# Patient Record
Sex: Female | Born: 2000 | Race: White | Hispanic: No | Marital: Single | Smoking: Never smoker
Health system: Southern US, Community
[De-identification: ages and names within clinical notes are randomized; demographics above are authoritative.]

## PROBLEM LIST (undated history)

## (undated) ENCOUNTER — Ambulatory Visit

## (undated) ENCOUNTER — Ambulatory Visit
Payer: Medicaid (Managed Care) | Attending: Student in an Organized Health Care Education/Training Program | Primary: Student in an Organized Health Care Education/Training Program

## (undated) ENCOUNTER — Telehealth

## (undated) ENCOUNTER — Encounter

## (undated) ENCOUNTER — Telehealth
Attending: Student in an Organized Health Care Education/Training Program | Primary: Student in an Organized Health Care Education/Training Program

## (undated) ENCOUNTER — Encounter
Attending: Student in an Organized Health Care Education/Training Program | Primary: Student in an Organized Health Care Education/Training Program

## (undated) ENCOUNTER — Telehealth: Attending: Dermatology | Primary: Dermatology

## (undated) ENCOUNTER — Emergency Department: Disposition: A | Discharge status: Home / Self Care | Payer: BLUE CROSS/BLUE SHIELD

## (undated) DIAGNOSIS — L732 Hidradenitis suppurativa: Secondary | ICD-10-CM

## (undated) DIAGNOSIS — L409 Psoriasis, unspecified: Secondary | ICD-10-CM

## (undated) DIAGNOSIS — F319 Bipolar disorder, unspecified: Secondary | ICD-10-CM

## (undated) HISTORY — DX: Hidradenitis suppurativa: L73.2

## (undated) HISTORY — DX: Bipolar disorder, unspecified: F31.9

## (undated) HISTORY — DX: Psoriasis, unspecified: L40.9

---
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
## 2021-11-11 NOTE — Unmapped (Signed)
Will send message to SR to contact patient to schd HS appt with Dr. Janyth Contes..per message from nurse .Marland Kitchen MJ

## 2021-11-11 NOTE — Unmapped (Signed)
Patient calling to request an appointment with Dr. Janyth Contes; states she is experiencing psoriatic arthritis and other skin issues and it has been a while since she's had an appt.     Message routed to call center.

## 2021-12-10 NOTE — Unmapped (Signed)
LVM to discuss scheduling an appt.       It's been a long time since I've seen her an doesn't sound overly urgent - we can offer with others first and if she wants to wait longer or be on wait list for me that would be okay.     You  Elsie Stain, MD2 weeks ago     DR  Do you want this patient , or do you want her to  see anyone since it experiencing psoriatic arthritis or do you wna    Patient calling to request an appointment with Dr. Janyth Contes; states she is experiencing psoriatic arthritis and other skin issues and it has been a while since she's had an appt.

## 2022-02-11 ENCOUNTER — Other Ambulatory Visit: Payer: Self-pay | Admitting: Pediatrics

## 2022-02-11 ENCOUNTER — Ambulatory Visit
Admission: RE | Admit: 2022-02-11 | Discharge: 2022-02-11 | Disposition: A | Discharge status: Home / Self Care | Payer: BLUE CROSS/BLUE SHIELD | Source: Ambulatory Visit | Attending: Pediatrics | Admitting: Pediatrics

## 2022-02-11 ENCOUNTER — Ambulatory Visit
Admission: RE | Admit: 2022-02-11 | Discharge: 2022-02-11 | Disposition: A | Discharge status: Home / Self Care | Payer: BLUE CROSS/BLUE SHIELD | Attending: Pediatrics | Admitting: Pediatrics

## 2022-02-11 DIAGNOSIS — R059 Cough, unspecified: Secondary | ICD-10-CM

## 2023-05-09 ENCOUNTER — Ambulatory Visit
Admit: 2023-05-09 | Discharge: 2023-05-09 | Disposition: A | Discharge status: Home / Self Care | Payer: BLUE CROSS/BLUE SHIELD | Attending: Emergency Medicine

## 2023-05-09 DIAGNOSIS — E860 Dehydration: Principal | ICD-10-CM

## 2023-05-09 DIAGNOSIS — F439 Reaction to severe stress, unspecified: Principal | ICD-10-CM

## 2023-05-09 DIAGNOSIS — R5383 Other fatigue: Principal | ICD-10-CM

## 2023-05-09 LAB — CBC W/ AUTO DIFF
BASOPHILS ABSOLUTE COUNT: 0 10*9/L (ref 0.0–0.1)
BASOPHILS RELATIVE PERCENT: 0.3 %
EOSINOPHILS ABSOLUTE COUNT: 0.2 10*9/L (ref 0.0–0.5)
EOSINOPHILS RELATIVE PERCENT: 1.8 %
HEMATOCRIT: 37.4 % (ref 34.0–44.0)
HEMOGLOBIN: 12 g/dL (ref 11.3–14.9)
LYMPHOCYTES ABSOLUTE COUNT: 1.8 10*9/L (ref 1.1–3.6)
LYMPHOCYTES RELATIVE PERCENT: 15.7 %
MEAN CORPUSCULAR HEMOGLOBIN CONC: 32.1 g/dL (ref 32.0–36.0)
MEAN CORPUSCULAR HEMOGLOBIN: 23.6 pg — ABNORMAL LOW (ref 25.9–32.4)
MEAN CORPUSCULAR VOLUME: 73.6 fL — ABNORMAL LOW (ref 77.6–95.7)
MEAN PLATELET VOLUME: 8.4 fL (ref 6.8–10.7)
MONOCYTES ABSOLUTE COUNT: 0.8 10*9/L (ref 0.3–0.8)
MONOCYTES RELATIVE PERCENT: 6.9 %
NEUTROPHILS ABSOLUTE COUNT: 8.6 10*9/L — ABNORMAL HIGH (ref 1.8–7.8)
NEUTROPHILS RELATIVE PERCENT: 75.3 %
NUCLEATED RED BLOOD CELLS: 0 /100{WBCs} (ref <=4)
PLATELET COUNT: 408 10*9/L (ref 150–450)
RED BLOOD CELL COUNT: 5.08 10*12/L (ref 3.95–5.13)
RED CELL DISTRIBUTION WIDTH: 14.7 % (ref 12.2–15.2)
WBC ADJUSTED: 11.4 10*9/L — ABNORMAL HIGH (ref 3.6–11.2)

## 2023-05-09 LAB — COMPREHENSIVE METABOLIC PANEL
ALBUMIN: 3.6 g/dL (ref 3.4–5.0)
ALKALINE PHOSPHATASE: 77 U/L (ref 46–116)
ALT (SGPT): 9 U/L — ABNORMAL LOW (ref 10–49)
ANION GAP: 12 mmol/L (ref 5–14)
AST (SGOT): 19 U/L (ref <=34)
BILIRUBIN TOTAL: 0.2 mg/dL — ABNORMAL LOW (ref 0.3–1.2)
BLOOD UREA NITROGEN: 10 mg/dL (ref 9–23)
BUN / CREAT RATIO: 15
CALCIUM: 9.4 mg/dL (ref 8.7–10.4)
CHLORIDE: 107 mmol/L (ref 98–107)
CO2: 22.7 mmol/L (ref 20.0–31.0)
CREATININE: 0.66 mg/dL (ref 0.55–1.02)
EGFR CKD-EPI (2021) FEMALE: >90 mL/min/{1.73_m2} (ref >=60)
GLUCOSE RANDOM: 88 mg/dL (ref 70–179)
POTASSIUM: 4.4 mmol/L (ref 3.4–4.8)
PROTEIN TOTAL: 7.2 g/dL (ref 5.7–8.2)
SODIUM: 142 mmol/L (ref 135–145)

## 2023-05-09 LAB — TSH: THYROID STIMULATING HORMONE: 3.442 u[IU]/mL (ref 0.550–4.780)

## 2023-05-09 LAB — MAGNESIUM: MAGNESIUM: 1.7 mg/dL (ref 1.6–2.6)

## 2023-05-09 LAB — HCG QUANTITATIVE, BLOOD: GONADOTROPIN, CHORIONIC (HCG) QUANT: <2.6 m[IU]/mL

## 2023-05-09 LAB — D-DIMER, QUANTITATIVE: D-DIMER QUANTITATIVE (CW,ML,HL,HS,CH,JS,JC,RX,RH): 310 ng{FEU}/mL (ref <=500)

## 2023-05-09 LAB — HIGH SENSITIVITY TROPONIN I - SINGLE: HIGH SENSITIVITY TROPONIN I: <3 ng/L (ref <=34)

## 2023-05-10 NOTE — Unmapped (Signed)
[facility]  Emergency Department Provider Note       ED Clinical Impression     Final diagnoses:   Fatigue, unspecified type (Primary)   Dehydration   Stress        Impression, ED Course, Assessment and Plan     Impression:     Patient is a 22 y.o. female with PMH of PCOS, anemia presenting with acute on chronic lightheadedness for the last 3 months as delineated below.    On exam, the patient is non-toxic appearing and in no acute distress. VS are notable for mild tachycardia to 102 but otherwise within normal limits. Physical exam revealed  A&Ox3  Speech normal cadence and fluent  CNs II-XII intact and symmetric  No nystagmus on smooth pursuit  Strength 5/5 in flexion/extension, symmetric x 4  Holds all 4 extrem to antigravity x 5 seconds  SILT  No dysmetria on FtN  No protnator drift  NL Gait  No nuchal rigidity.      Differential diagnosis is broad and includes dehydration, lightheadedness, orthostatic hypotension, anemia, low suspicion for ACS, myocarditis, PE given no tachypnea, hypoxia, chest pain, also consider vertigo although no findings suspicious for this on exam such as nystagmus, this seems to be more orthostatic in nature.  Consider pregnancy related etiologies, consider infectious etiologies however no fevers or chills.  No foci of infection.  As for her multiple chronic symptoms, unclear etiology, presentation is not consistent with acute subarachnoid hemorrhage, sentinel bleed, she has chronic headaches, I do suspect that she could be having rebound headaches from her chronic NSAID use.  No GI bleed symptoms such as hematochezia or melena.  Consider autoimmune, electrolyte disturbance, POTS.  Consider primary intracranial etiology however nonfocal neurological exam, she has history of chronic headaches and no acute changes to her headaches.  Do consider possibility for intracranial mass and/or malignancy as well.  Also consider functional etiology given her life stressors.    Plan for EKG, basic labs, TSH, magnesium, single troponin, d-dimer, RPP, and UA.  Will provide p.o. fluids.    Reassessment-    Labs with mild leukocytosis, which I discussed with the patient she does have a neutrophil predominance however she has no obvious foci infection.  Electrolytes grossly unremarkable, troponin negative, D-dimer negative, negative pregnancy test.  Remains well-appearing, nontoxic, hemodynamically stable, provided with oral hydration here in the emergency department secondary to IV fluid shortage nationally at the current time of health care.  She is able to tolerate p.o. well, ambulate without any symptoms of severe presyncope.  We discussed the importance of aggressive oral hydration and close PCP follow-up for further management and workup as indicated for her symptoms.    I discussed my evaluation of the patient's symptoms, my clinical impression, and my proposed outpatient treatment plan. We have discussed anticipatory guidance, scheduled follow-up, and careful return precautions. I provided an opportunity and answered any questions the patient had. The patient express understanding and are comfortable with the discharge plan.      Additional Medical Decision Making       Discussion of Management with other Physicians, QHP or Appropriate Source: N/A  Independent Interpretation of Studies:   EKG: as below    RADIOLOGY: as below     Labs as above  External Records Reviewed: Patient's most recent outpatient clinic note  Escalation of Care, Consideration of Admission/Observation/Transfer:  N/A  Social determinants that significantly affected care:  Prescription drug(s) considered but not prescribed:   Diagnostic  tests considered but not performed:   History obtained from other sources:     Labs and radiology results that were available during my care of the patient were independently reviewed by me and considered in my medical decision making.    Portions of this record have been created using Scientist, clinical (histocompatibility and immunogenetics). Dictation errors have been sought, but may not have been identified and corrected.  ____________________________________________    Time seen: May 09, 2023 5:53 PM     History     Chief Complaint  Fatigue      HPI   Nekeia A Sweezey is a 22 y.o. female with a past medical history of psoriatic arthritis, PCOS, asthma, and anemia who presents with global fatigue and lightheadedness. The patient reports waking up this morning and feeling lightheaded when attempting to stand in the setting of several months of global fatigue and presyncope. She notes that her symptoms are worsened with positional changes such as from laying to standing.  Associated symptoms with this include mild shortness of breath.  Currently without any shortness of breath.  She states that she has had months of fatigue and feeling weak globally and even after a full night of sleep.  She endorses taking 3-4 tablets of Advil or Ibuprofen once or twice a day with relief of her chronic headaches.  No acute change in her symptomatology/headache today, not sudden onset, not worst of life. Of note, she has a history of irregular menstrual cycles such that her LMP ended 1 week ago and lasted for 2 weeks and that her cycle before lasted for 2 months. They advised that she follow up but she has yet to do so.  No fevers, chills, cough, dysuria, melena or hematochezia.  No aggravating or alleviating factors. She notes experiencing a lot of familial stress lately. She denies SI, HI or AVH.    Past Medical History:   Diagnosis Date    Axillary abscess 07/02/2014    Bilateral    Ear infection 04/08/2017    Eczema     Hidradenitis suppurativa 04/01/2016    Obese     Psoriasis 01/28/2015       Past Surgical History:   Procedure Laterality Date    PR EXC SWEAT GLAND LESN AXILL,SIMPL Bilateral 07/03/2014    Procedure: EXCISION OF SKIN & SUBCUTANEOUS TISSUE FOR HIDRADENITIS, AXILLARY; WITH SIMPLE OR INTERMEDIATE REPAIR; Surgeon: Bunnie Pion, MD;  [facility]: [facility];  Service: Pediatric Surgery    SKIN BIOPSY         No current facility-administered medications for this encounter.    Current Outpatient Medications:     FEROSUL 325 mg (65 mg iron) tablet, Take 1 tablet by mouth., Disp: , Rfl:     FLUoxetine (PROZAC) 40 MG capsule, Take 40 mg by mouth daily., Disp: , Rfl:     ibuprofen (MOTRIN) 800 MG tablet, Take 1 tablet (800 mg total) by mouth every six (6) hours as needed., Disp: 30 tablet, Rfl: 0    triamcinolone (KENALOG) 0.1 % ointment, Apply topically two (2) times a day as needed., Disp: , Rfl:     Allergies  Patient has no known allergies.    Family History   Problem Relation Age of Onset    Rheum arthritis Mother     Hypotension Mother         Taking midodrine    Rheum arthritis Brother     Chromosomal disorder Sister  del 8(p23.3p23.3)    Other Brother         laryngeal cleft, static encephalopathy with autism and speech delay, and swallowing dysfunction     Other Father         Chronic pain for work injury, died of taking multiple medications  leading to respiratory failure at 21yo    Hypertension Brother     Other Brother         Dysautonomia?    Ehlers-Danlos syndrome Brother         Type 3    Melanoma Neg Hx     Basal cell carcinoma Neg Hx     Squamous cell carcinoma Neg Hx        Social History  Social History     Tobacco Use    Smoking status: Never    Smokeless tobacco: Never            Physical Exam     VITAL SIGNS:    ED Triage Vitals [05/09/23 1613]   Enc Vitals Group      BP 121/101      Heart Rate 102      SpO2 Pulse       Resp 18      Temp 36.8 ??C (98.3 ??F)      Temp Source Temporal      SpO2 100 %     Constitutional: Alert and oriented. Well appearing and in no distress.  Eyes: Conjunctivae are normal.  ENT       Head: Normocephalic and atraumatic.       Nose: No congestion.       Mouth/Throat: Mucous membranes are moist.       Neck: No stridor.  Cardiovascular: Normal rate, regular rhythm.   Respiratory: Normal respiratory effort. Breath sounds are normal.  Gastrointestinal: Soft and nontender, non-distended  Genitourinary:   Musculoskeletal: No lower extremity tenderness or edema.  Neurologic: Normal speech and language. No gross focal neurologic deficits are appreciated.  Remainder as delineated above  Skin: Skin is warm, dry and intact. No rash noted.  Psychiatric: Mood and affect are normal. Speech and behavior are normal.     EKG     Normal sinus rhythm at a rate of 90 bpm, no ST elevations, no ST depressions, no ectopy. When compared with ECG of 08/08/20, new T wave inversion located in lead III however no other acute changes, as well her D-dimer test was negative.       Pertinent labs & imaging results that were available during my care of the patient were reviewed by me and considered in my medical decision making (see chart for details).    Documentation assistance was provided by Humberto Seals, Scribe on May 09, 2023 at 6:59 PM for Carolee Rota, MD.     Documentation assistance was provided by the scribe in my presence.  The documentation recorded by the scribe has been reviewed by me and accurately reflects the services I personally performed.                Berkley Harvey, MD  05/09/23 1924

## 2023-05-10 NOTE — Unmapped (Signed)
Patient states over the past few weeks fatigue and dizziness.  States today she fell in the shower (no LOC/head strike) b/c legs gave out.   Endorses SOB. Recent period lasting two months.   Reports hx of same but has been getting acutely worse.   Pt notes having many health issues.

## 2023-12-16 NOTE — Unmapped (Signed)
 Assessment and Plan:     Christy Mercado is a lage female presenting to establish care     #Depression, Anxiety, OCD, bipolar depression?  -PHQ-9 score 13  -GAD-7 score: 12  -she does appear to have long standing history of anxiety and depression.   -she was referred to Psychiatry in 2018 but was placed on do not schedule list as she did not complete proper intake paperwork  -Today she reports she was diagnosed with BIPOLAR DEPRESSION with [facility] and was placed on Lamictal  (she has not been on this medication for a year however)   -Will RESTART Lamictal  and refer to psychiatry  -     lamoTRIgine  (LAMICTAL ) 25 MG tablet; Take 1 tablet (25 mg total) by mouth daily.  -     Ambulatory referral to Psychiatry; Future    #Iron  deficiency anemia, Inguinal Adenopathy, Hepatomegaly, Splenomegaly  -Incidental on previous labs and imaging (CT Scan in 2022 recommended repeat imaging to assess for adenopathy)  -These results were briefly discussed. Advised to repeat CT scan as recommended.   -order below placed:   -     CT abdomen pelvis with contrast; Future  -     CBC w/ Differential    #Obesity class II  Wt Readings from Last 3 Encounters:   12/17/23 (!) 115.8 kg (255 lb 3.2 oz)   08/09/20 118.8 kg (261 lb 14.4 oz) (>99%, Z= 2.53)*   03/17/17 118.1 kg (260 lb 5.8 oz) (>99%, Z= 2.76)*     * Growth percentiles are based on CDC (Girls, 2-20 Years) data.   -she had previously followed with [facility]--however has not been seen since 2021  -Offered referral to Bariatric Surgery or Weight Management with [facility]. She is agreeable. Referral to weight management in [place] placed. Will obtain labs below as well  -     Comprehensive Metabolic Panel  -     Hemoglobin A1c  -     Lipid Panel  -     Amb Referral for Weight Management; Future      #Hidradenitis Suppurative, Psoriasis  -She had previously been Dermatology    #Joint pain, Fatigue  -on chart review, previously saw Pediatric Rheumatology on 02/18/2016: Assessment and Plan:  Kitzia presents with an approximately 12 month history of episodic multiple joint pain in the setting of hidraenitis suppurativa (HS), previously treated with Humira without effect with this discontinued in 03/2015. She is significantly overweight. 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 There is no evidence for inflammatory arthritis in her peripheral joints. She does have some pain in her Achilles and areas where connective tissues join bone (enthesitis) as well as pain in her bilateral SI joints--this may be related to mechanical issues or it may be inflammatory--we will obtain a plain film of her SIs to evaluate for possible sacroiliitis. However, she also will benefit from PT to address her mechanical joint issues. She is referred to PT for strengthening and balancing of the quadriceps for joint protection. Also, it is asked that they begin a low impact, graduated program of aerobic conditioning. It is important that she work with her primary care provider on weight control/weight loss. This is discussed with [name] and her mother.    -She is concerned for psoriatic arthritis  -     Comprehensive Metabolic Panel  -     Vitamin D 25 Hydroxy (25OH D2 + D3)  -  TSH  -     Anti-Nuclear Antibody (ANA)  -     ENA Panel  -     Rheumatoid Factor, Quantitative  -     Cyclic Citrul Peptide Antibody, IgG  -     Cancel: HLA-B27 antigen  -     Anti-DNA antibody, double-stranded  -     HLA-B27 antigen; Future  -     Vitamin B12 Level; Future  -Will need to consider fibromyalgia on the differential as well      HEALTH MAINTENANCE  -She will need to return for Pap smear   -She is interested in STD testing. Orders placed  -     Chlamydia/Gonorrhoeae NAA  -     TRICHOMONAS NAA  -     Hepatitis C antibody  -     HIV Antigen/Antibody Combo  -     Syphilis Screen  -Received Tdap without difficulty. VIS provided  -     Tdap vaccine greater than or equal to 7yo IM (Adacel, Decavax)    To follow up in around 2 months for CPE but will also rediscuss depression and additional concerns.     I personally spent >60 minutes face-to-face and non-face-to-face in the care of this patient, which includes all pre, intra, and post visit time on the date of service.  All documented time was specific to the E/M visit and does not include any procedures that may have been performed.        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 Denies     Patient's last menstrual period was 11/18/2023.    She is always in pain, tired and fatigued       ROS:     ROS negative unless otherwise noted in HPI    Vital Signs:     Wt Readings from Last 3 Encounters:   12/17/23 (!) 115.8 kg (255 lb 3.2 oz)   08/09/20 118.8 kg (261 lb 14.4 oz) (>99%, Z= 2.53)*   03/17/17 118.1 kg (260 lb 5.8 oz) (>99%, Z= 2.76)*     * Growth percentiles are based on CDC (Girls, 2-20 Years) data.     Temp Readings from Last 3 Encounters:   12/17/23 36.6 ??C (97.8 ??F)   05/09/23 36.8 ??C (98.3 ??F) (Temporal)   08/11/20 37.1 ??C (98.8 ??F) (Oral)     BP Readings from Last  3 Encounters:   12/17/23 124/80   05/09/23 121/101   08/11/20 117/79     Pulse Readings from Last 3 Encounters:   12/17/23 84   05/09/23 102   08/11/20 81       Objective:     General Appearance: Alert, cooperative, no distress, appears stated age.   Head and Neck: Normocephalic, atraumatic   EENT: Eyes: PERRLA, Conjuntiva clear; EOMI, sclera clear  Respiratory:   Respiratory effort unremarkable. No wheezes   Musculoskeletal: Gait and station unremarkable. Normal ROM.   Extremities :No edema.  Peripheral pulses normal  Integumentary: Warm and dry.No rashes.  No abnormal appearing skin lesions on exposed skin  Neurologic: Alert and oriented x3. CN II-XII grossly intact.   Psychiatric: Mood and affect appropriate for situation.

## 2023-12-17 ENCOUNTER — Encounter
Admit: 2023-12-17 | Discharge: 2023-12-18 | Payer: Medicaid (Managed Care) | Attending: Student in an Organized Health Care Education/Training Program | Primary: Student in an Organized Health Care Education/Training Program

## 2023-12-17 ENCOUNTER — Ambulatory Visit: Admit: 2023-12-17 | Discharge: 2023-12-18 | Payer: Medicaid (Managed Care)

## 2023-12-17 DIAGNOSIS — D500 Iron deficiency anemia secondary to blood loss (chronic): Secondary | ICD-10-CM | POA: Diagnosis not present

## 2023-12-17 DIAGNOSIS — Z23 Encounter for immunization: Secondary | ICD-10-CM | POA: Diagnosis not present

## 2023-12-17 DIAGNOSIS — R5383 Other fatigue: Secondary | ICD-10-CM | POA: Diagnosis not present

## 2023-12-17 DIAGNOSIS — F32A Depression, unspecified: Secondary | ICD-10-CM | POA: Diagnosis not present

## 2023-12-17 DIAGNOSIS — L732 Hidradenitis suppurativa: Secondary | ICD-10-CM | POA: Diagnosis not present

## 2023-12-17 DIAGNOSIS — E66812 Obesity, Class II, BMI 35-39.9: Principal | ICD-10-CM

## 2023-12-17 DIAGNOSIS — F319 Bipolar disorder, unspecified: Secondary | ICD-10-CM | POA: Diagnosis not present

## 2023-12-17 DIAGNOSIS — Z113 Encounter for screening for infections with a predominantly sexual mode of transmission: Secondary | ICD-10-CM | POA: Diagnosis not present

## 2023-12-17 DIAGNOSIS — R160 Hepatomegaly, not elsewhere classified: Secondary | ICD-10-CM | POA: Diagnosis not present

## 2023-12-17 DIAGNOSIS — M2550 Pain in unspecified joint: Secondary | ICD-10-CM | POA: Diagnosis not present

## 2023-12-17 DIAGNOSIS — F419 Anxiety disorder, unspecified: Secondary | ICD-10-CM | POA: Diagnosis not present

## 2023-12-17 DIAGNOSIS — D509 Iron deficiency anemia, unspecified: Secondary | ICD-10-CM | POA: Diagnosis not present

## 2023-12-17 DIAGNOSIS — L409 Psoriasis, unspecified: Secondary | ICD-10-CM | POA: Diagnosis not present

## 2023-12-17 DIAGNOSIS — M357 Hypermobility syndrome: Secondary | ICD-10-CM | POA: Diagnosis not present

## 2023-12-17 DIAGNOSIS — R162 Hepatomegaly with splenomegaly, not elsewhere classified: Secondary | ICD-10-CM | POA: Diagnosis not present

## 2023-12-17 DIAGNOSIS — R590 Localized enlarged lymph nodes: Secondary | ICD-10-CM | POA: Diagnosis not present

## 2023-12-17 DIAGNOSIS — E282 Polycystic ovarian syndrome: Secondary | ICD-10-CM | POA: Diagnosis not present

## 2023-12-17 DIAGNOSIS — F339 Major depressive disorder, recurrent, unspecified: Principal | ICD-10-CM

## 2023-12-17 LAB — COMPREHENSIVE METABOLIC PANEL
ALBUMIN: 3.9 g/dL (ref 3.4–5.0)
ALKALINE PHOSPHATASE: 66 U/L (ref 46–116)
ALT (SGPT): 10 U/L (ref 10–49)
ANION GAP: 11 mmol/L (ref 5–14)
AST (SGOT): 20 U/L (ref <=34)
BILIRUBIN TOTAL: 0.4 mg/dL (ref 0.3–1.2)
BLOOD UREA NITROGEN: 12 mg/dL (ref 9–23)
BUN / CREAT RATIO: 16
CALCIUM: 9.3 mg/dL (ref 8.7–10.4)
CHLORIDE: 105 mmol/L (ref 98–107)
CO2: 23 mmol/L (ref 20.0–31.0)
CREATININE: 0.74 mg/dL (ref 0.55–1.02)
EGFR CKD-EPI (2021) FEMALE: >90 mL/min/1.73m2 (ref >=60)
GLUCOSE RANDOM: 81 mg/dL (ref 70–99)
POTASSIUM: 4.2 mmol/L (ref 3.4–4.8)
PROTEIN TOTAL: 7.5 g/dL (ref 5.7–8.2)
SODIUM: 139 mmol/L (ref 135–145)

## 2023-12-17 LAB — CBC W/ AUTO DIFF
BASOPHILS ABSOLUTE COUNT: 0 10*9/L (ref 0.0–0.1)
BASOPHILS RELATIVE PERCENT: 0.4 %
EOSINOPHILS ABSOLUTE COUNT: 0.1 10*9/L (ref 0.0–0.5)
EOSINOPHILS RELATIVE PERCENT: 1.6 %
HEMATOCRIT: 38.1 % (ref 34.0–44.0)
HEMOGLOBIN: 12.4 g/dL (ref 11.3–14.9)
LYMPHOCYTES ABSOLUTE COUNT: 1.2 10*9/L (ref 1.1–3.6)
LYMPHOCYTES RELATIVE PERCENT: 15.3 %
MEAN CORPUSCULAR HEMOGLOBIN CONC: 32.6 g/dL (ref 32.0–36.0)
MEAN CORPUSCULAR HEMOGLOBIN: 23.2 pg — ABNORMAL LOW (ref 25.9–32.4)
MEAN CORPUSCULAR VOLUME: 71.2 fL — ABNORMAL LOW (ref 77.6–95.7)
MEAN PLATELET VOLUME: 8.7 fL (ref 6.8–10.7)
MONOCYTES ABSOLUTE COUNT: 0.5 10*9/L (ref 0.3–0.8)
MONOCYTES RELATIVE PERCENT: 6.8 %
NEUTROPHILS ABSOLUTE COUNT: 5.9 10*9/L (ref 1.8–7.8)
NEUTROPHILS RELATIVE PERCENT: 75.9 %
PLATELET COUNT: 422 10*9/L (ref 150–450)
RED BLOOD CELL COUNT: 5.35 10*12/L — ABNORMAL HIGH (ref 3.95–5.13)
RED CELL DISTRIBUTION WIDTH: 15.2 % (ref 12.2–15.2)
WBC ADJUSTED: 7.8 10*9/L (ref 3.6–11.2)

## 2023-12-17 LAB — IRON & TIBC
IRON SATURATION: 11 % — ABNORMAL LOW (ref 20–55)
IRON: 43 ug/dL — ABNORMAL LOW (ref 50–170)
TOTAL IRON BINDING CAPACITY: 389 ug/dL (ref 250–425)

## 2023-12-17 LAB — RHEUMATOID FACTOR, QUANT: RHEUMATOID FACTOR: <3.5 [IU]/mL (ref <14.0)

## 2023-12-17 LAB — SLIDE REVIEW

## 2023-12-17 LAB — LIPID PANEL
CHOLESTEROL: 91 mg/dL (ref <200)
HDL CHOLESTEROL: 51 mg/dL (ref >50)
LDL CHOLESTEROL CALCULATED: 36 mg/dL (ref <100)
NON-HDL CHOLESTEROL: 40 mg/dL (ref <130)
TRIGLYCERIDES: 28 mg/dL (ref <150)

## 2023-12-17 LAB — TSH: THYROID STIMULATING HORMONE: 3.135 u[IU]/mL (ref 0.550–4.780)

## 2023-12-17 LAB — FERRITIN: FERRITIN: 10.5 ng/mL (ref 7.3–270.7)

## 2023-12-17 LAB — VITAMIN B12: VITAMIN B-12: 261 pg/mL (ref 211–911)

## 2023-12-17 MED ORDER — LAMOTRIGINE 25 MG TABLET
ORAL_TABLET | Freq: Every day | ORAL | 2 refills | 30.00000 days | Status: CP
Start: 2023-12-17 — End: 2024-03-16

## 2023-12-17 NOTE — Unmapped (Signed)
 Addended by: KEVEN CRUMBLY PAP on: 12/17/2023 04:30 PM     Modules accepted: Orders

## 2023-12-17 NOTE — Unmapped (Signed)
 For Psychiatry, please call: Call [phone]

## 2023-12-18 LAB — HEMOGLOBIN A1C
ESTIMATED AVERAGE GLUCOSE: 123 mg/dL
HEMOGLOBIN A1C: 5.9 % — ABNORMAL HIGH (ref 4.8–5.6)

## 2023-12-19 LAB — HIV ANTIGEN/ANTIBODY COMBO: HIV ANTIGEN/ANTIBODY COMBO: NONREACTIVE

## 2023-12-19 LAB — HEPATITIS C ANTIBODY
HCV S/CO VALUE: 0.09
HEPATITIS C ANTIBODY: NONREACTIVE

## 2023-12-20 LAB — CYCLIC CITRUL PEPTIDE ANTIBODY, IGG
CCP ANTIBODIES: 1.6 {ELISA'U} (ref <7.0)
CCP IGG ANTIBODIES: NEGATIVE

## 2023-12-20 LAB — EXTRACTABLE NUCLEAR ANTIGEN: ENA SCREEN: <0.1 ENA Units (ref <0.70)

## 2023-12-20 LAB — SYPHILIS SCREEN: SYPHILIS RPR SCREEN: NONREACTIVE

## 2023-12-20 LAB — ANTI-DNA ANTIBODY, DOUBLE-STRANDED: DSDNA ANTIBODY: NEGATIVE

## 2023-12-20 LAB — ANA: ANTINUCLEAR ANTIBODIES (ANA): NEGATIVE

## 2023-12-20 NOTE — Unmapped (Signed)
 Addended by: HALL, RYAN on: 12/20/2023 07:08 AM     Modules accepted: Orders

## 2023-12-21 LAB — VITAMIN D 25 HYDROXY: VITAMIN D, TOTAL (25OH): 36.3 ng/mL (ref 20.0–80.0)

## 2023-12-22 MED ORDER — FERROUS SULFATE 325 MG (65 MG IRON) TABLET
ORAL_TABLET | ORAL | 1 refills | 90.00000 days | Status: CP
Start: 2023-12-22 — End: 2024-06-19

## 2023-12-22 MED ORDER — SEMAGLUTIDE 0.25 MG OR 0.5 MG (2 MG/1.5 ML) SUBCUTANEOUS PEN INJECTOR
SUBCUTANEOUS | 2 refills | 28.00000 days | Status: CP
Start: 2023-12-22 — End: 2024-03-21

## 2023-12-24 NOTE — Unmapped (Signed)
 PA for Ozempic  was DENIED via [facility]

## 2023-12-26 LAB — HLA-B27 ANTIGEN: HLA B27: NEGATIVE

## 2023-12-27 DIAGNOSIS — E66812 Obesity, Class II, BMI 35-39.9: Principal | ICD-10-CM

## 2023-12-27 DIAGNOSIS — R7303 Prediabetes: Principal | ICD-10-CM

## 2023-12-27 MED ORDER — METFORMIN ER 500 MG TABLET,EXTENDED RELEASE 24 HR
ORAL_TABLET | Freq: Two times a day (BID) | ORAL | 2 refills | 30.00000 days | Status: CP
Start: 2023-12-27 — End: 2024-03-26

## 2024-01-03 ENCOUNTER — Inpatient Hospital Stay: Admit: 2024-01-03 | Discharge: 2024-01-03 | Payer: PRIVATE HEALTH INSURANCE

## 2024-01-03 DIAGNOSIS — R590 Localized enlarged lymph nodes: Secondary | ICD-10-CM | POA: Diagnosis not present

## 2024-01-03 MED ADMIN — iohexol (OMNIPAQUE) 350 mg iodine/mL solution 100 mL: 100 mL | INTRAVENOUS | @ 20:00:00 | Stop: 2024-01-03
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
## 2024-01-16 DIAGNOSIS — R7303 Prediabetes: Principal | ICD-10-CM

## 2024-01-16 DIAGNOSIS — E66812 Obesity, Class II, BMI 35-39.9: Principal | ICD-10-CM

## 2024-01-16 MED ORDER — METFORMIN ER 500 MG TABLET,EXTENDED RELEASE 24 HR
ORAL_TABLET | Freq: Two times a day (BID) | ORAL | 2 refills | 30.00000 days
Start: 2024-01-16 — End: 2024-04-15

## 2024-01-17 MED ORDER — METFORMIN ER 500 MG TABLET,EXTENDED RELEASE 24 HR
ORAL_TABLET | Freq: Two times a day (BID) | ORAL | 2 refills | 30.00000 days | Status: CP
Start: 2024-01-17 — End: 2024-04-16

## 2024-01-18 ENCOUNTER — Encounter: Payer: Self-pay | Admitting: Psychiatry

## 2024-01-18 ENCOUNTER — Other Ambulatory Visit: Payer: Self-pay

## 2024-01-18 ENCOUNTER — Ambulatory Visit (INDEPENDENT_AMBULATORY_CARE_PROVIDER_SITE_OTHER): Payer: Self-pay | Admitting: Psychiatry

## 2024-01-18 VITALS — BP 118/78 | HR 76 | Temp 98.1°F | Ht 67.36 in | Wt 250.2 lb

## 2024-01-18 DIAGNOSIS — F4310 Post-traumatic stress disorder, unspecified: Secondary | ICD-10-CM | POA: Diagnosis not present

## 2024-01-18 DIAGNOSIS — F0630 Mood disorder due to known physiological condition, unspecified: Secondary | ICD-10-CM | POA: Diagnosis not present

## 2024-01-18 MED ORDER — LAMOTRIGINE 25 MG PO TABS
50.0000 mg | ORAL_TABLET | Freq: Every day | ORAL | 1 refills | Status: DC
  Filled 2024-01-18: qty 60, 30d supply, fill #0
  Filled 2024-03-08: qty 60, 30d supply, fill #1

## 2024-01-18 NOTE — Patient Instructions (Signed)
 Increase lamotrigine  50 mg daily  Referral to therapy  Next appointment- 10/14 at 1 pm

## 2024-02-17 ENCOUNTER — Encounter
Admit: 2024-02-17 | Discharge: 2024-02-17 | Payer: Medicaid (Managed Care) | Attending: Student in an Organized Health Care Education/Training Program | Primary: Student in an Organized Health Care Education/Training Program

## 2024-02-17 DIAGNOSIS — R7303 Prediabetes: Secondary | ICD-10-CM | POA: Diagnosis not present

## 2024-02-17 DIAGNOSIS — Z01411 Encounter for gynecological examination (general) (routine) with abnormal findings: Secondary | ICD-10-CM | POA: Diagnosis not present

## 2024-02-17 DIAGNOSIS — E66812 Obesity, Class II, BMI 35-39.9: Principal | ICD-10-CM

## 2024-02-17 DIAGNOSIS — L732 Hidradenitis suppurativa: Secondary | ICD-10-CM | POA: Diagnosis not present

## 2024-02-17 DIAGNOSIS — N898 Other specified noninflammatory disorders of vagina: Secondary | ICD-10-CM | POA: Diagnosis not present

## 2024-02-17 DIAGNOSIS — Z23 Encounter for immunization: Secondary | ICD-10-CM | POA: Diagnosis not present

## 2024-02-17 DIAGNOSIS — R590 Localized enlarged lymph nodes: Secondary | ICD-10-CM | POA: Diagnosis not present

## 2024-02-17 DIAGNOSIS — E611 Iron deficiency: Secondary | ICD-10-CM | POA: Diagnosis not present

## 2024-02-17 DIAGNOSIS — R55 Syncope and collapse: Secondary | ICD-10-CM | POA: Diagnosis not present

## 2024-02-17 DIAGNOSIS — F339 Major depressive disorder, recurrent, unspecified: Secondary | ICD-10-CM | POA: Diagnosis not present

## 2024-02-17 DIAGNOSIS — Z01419 Encounter for gynecological examination (general) (routine) without abnormal findings: Principal | ICD-10-CM

## 2024-02-17 DIAGNOSIS — F319 Bipolar disorder, unspecified: Principal | ICD-10-CM

## 2024-02-17 DIAGNOSIS — R5383 Other fatigue: Principal | ICD-10-CM

## 2024-02-17 MED ORDER — LAMOTRIGINE 25 MG TABLET
ORAL_TABLET | Freq: Every day | ORAL | 2 refills | 30.00000 days
Start: 2024-02-17 — End: 2024-05-17

## 2024-02-17 NOTE — Unmapped (Addendum)
-  previously followed by dermatology. No concerns at this time.

## 2024-02-17 NOTE — Unmapped (Signed)
 Patient ID: Christy Mercado, date of birth 11-26-2000 is a 23 y.o. female.    Date: February 17, 2024    Assessment & Plan    Christy Mercado is a 23 y.o. female presenting for well woman exam  Assessment & Plan  Well woman exam  Vaginal discharge  Immunization due  -Breast exam and Pap smear performed with chaperone present   -STD testing obtained in July and negative   -BP is slightly elevated but at goal per JNC-8 guidelines of <140/80  -Flu shot given. VIS provided     Orders:    Pap Test    Vaginitis Molecular Panel    INFLUENZA VACCINE IIV3(IM)(PF)6 MOS UP    Bipolar depression    (CMS-HCC)  Recurrent major depressive disorder, remission status unspecified  -PHQ-9 score 13 > 5  -GAD-7 score: 12> 10    -long standing hx of depression and anxiety. She previously was diagnosed with bipolar depression with [facility] and was started on lamictal    -At initial visit on 12/17/2023, we restarted her on lamictal  and referred to psychiatry   -She has seen psychiatry ([facility]) and is taking: LAMICTAL  50mg  every day   -she is going to start therapy this month and then follow up with psychiatry next month (03/14/2024)       Inguinal adenopathy  -noted on chart review   -CT scan on 01/03/2024: Resolution of inguinal lymphadenopathy and splenomegaly noted on the 08/09/2020 examination. No new or enlarging lymphadenopathy        Obesity, Class II, BMI 35-39.9  Prediabetes  -A1C was found to be in the prediabetes range at 5.9% in 11/2023  -we started Metformin  XR 500mg  BID  -She additionally has been more mindful regarding her diet and is making sure to incorporate exercise.   -Since our last visit she has lost 14 lbs     Wt Readings from Last 3 Encounters:   02/17/24 (!) 109.5 kg (241 lb 8 oz)   12/17/23 (!) 115.8 kg (255 lb 3.2 oz)   08/09/20 118.8 kg (261 lb 14.4 oz) (>99%, Z= 2.53)*     * Growth percentiles are based on CDC (Girls, 2-20 Years) data.     -Continue Metformin  at this time and making lifestyle modifications       Low iron   -iron  was found to be mildly low in July 2025 and we started an iron  supplement. However she notes that this has caused her to feel bad and stopped this medication   -Will recheck Iron  next visit       Syncope, unspecified syncope type  -she reports a chronic history of syncope for the last year  -additionally experiencing tachycardia   -she notes that there was previously a concern for POTS. She is unsure regarding FH     -When reviewing her chart, evaluated by Pediatric Cardiology with [facility] on 03/17/2017:   Quianna is a 23  y.o. 0  m.o. female seen in consultation for near syncope.  She also is known to have hyper mobile joints.  Her symptoms sound vaso-vagal/dysautonomic in nature.  Her cardiac exam, ECG, and echocardiogram are normal.  She has multiple other medical problems including hidradenitis suppra, psoriasis, and eczema.     Plan  Recommendations:   1. Reviewed the nature of vasovagal near syncope  2. Advised increasing water/juice/milk/sports drink intake with goal of 8-12 cups per day  3. Advised increasing salt intake and suggested Thermotabs  4.  Encouraged daily activity with goal of being active for 1h per day  5. Encouraged decreasing/eliminating caffeine intake  6. Based on history of hidrandenitis suppra and obesity, suggest checking a fasting lipid panel at the next time blood work is needed, as HS in adult years is associated with early coronary artery disease and stroke    -We will assess for cardiac etiologies with TTE and Zio   -Ordering MRI Brain as she is starting to have more frequent episodes     Orders:    Echocardiogram W Colorflow Spectral Doppler; Future    External ECG-8 days to 15 days (ZIO); Future    MRI Brain W Wo Contrast; Future    -Consider referral for tilt table testing  -Consider starting medication for anxiety such as Hydroxyzine   -Consider cardiac re-evaluation and/or neurology referral  -Emergency room precautions were discussed with patient who verbalized agreement and understanding  Hidradenitis suppurativa  -previously followed by dermatology. No concerns at this time.        Fatigue, unspecified type  -Labs from 12/17/2023 were reassuring:    -ANA negative, RF negative, Anto-CCP negative, Anti dsDNA negative, HLA-B27 negative, ENA negative  -will consider checking for Mono vs CMV vs other causes if symptoms continue to persist (history of Mono in 07/2020)            Return in about 6 weeks (around 03/30/2024) for Recheck.    Chief Complaint:   Chief Complaint   Patient presents with    Annual Exam     Has been having rapid heartbeat and fainted           History of Present Illness  Christy Mercado is a 23 year old female who presents for an annual physical exam and evaluation of fainting episodes.    She experiences fainting episodes approximately once a year, with the most recent episode occurring last week. These episodes are often associated with stress and exhaustion, particularly when she is unwell or unable to care for herself. No anxiety during these episodes, but she notes a higher heart rate and random chest pains. She has not undergone a comprehensive cardiac workup but has had EKGs in the past. She recalls that when she was diagnosed with hidradenitis suppurativa, her clinicians were also considering POTS because she experienced lightheadedness and dizziness upon standing.    She has a history of hidradenitis suppurativa and experiences migraines, which she describes as 'bulging' and associates with chest pain. She finds it challenging to identify specific triggers for her symptoms beyond stress.    She reports easy bruising over the past month or two, with large bruises appearing even with minor contact. 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    She is currently taking Lamictal , with a recent dosage increase to 50 mg, and is not taking iron  supplements due to feeling sick from them.    She drinks two to three large water bottles a day and walks two to three times a week for exercise. She has lost weight, which she attributes to dietary changes, including eating more grilled chicken and avoiding soda. She works at a family [employer] and engages in regular physical activity.    LMP: 02/10/2024  She is not currently sexually active      Allergies:   Allergies as of 02/17/2024    (No Known Allergies)  Problem List: Problem List[1]    The following information was reviewed by members of the visit team:  Allergies - Medications - Medical History - Surgical History - Family   History -          Vitals:    02/17/24 1110   BP: 139/87   Pulse: 71   Temp: 36.3 ??C (97.3 ??F)   SpO2: 100%   Weight: (!) 109.5 kg (241 lb 8 oz)   Height: 171.5 cm (5' 7.5)     Body mass index is 37.27 kg/m??.    Wt Readings from Last 3 Encounters:   02/17/24 (!) 109.5 kg (241 lb 8 oz)   12/17/23 (!) 115.8 kg (255 lb 3.2 oz)   08/09/20 118.8 kg (261 lb 14.4 oz) (>99%, Z= 2.53)*     * Growth percentiles are based on CDC (Girls, 2-20 Years) data.       ROS: ROS negative unless otherwise noted in HPI.    EXAM:   Physical Exam  Exam conducted with a chaperone present.   Constitutional:       General: She is not in acute distress.     Appearance: Normal appearance. She is not ill-appearing, toxic-appearing or diaphoretic.   HENT:      Head: Normocephalic and atraumatic.   Eyes:      General:         Right eye: No discharge.         Left eye: No discharge.      Extraocular Movements: Extraocular movements intact.   Cardiovascular:      Rate and Rhythm: Normal rate and regular rhythm.   Chest:   Breasts:     Right: No inverted nipple, mass, nipple discharge, skin change or tenderness.      Left: No inverted nipple, mass, nipple discharge, skin change or tenderness.      Comments: Scarring in left axilla  Abdominal:      Palpations: Abdomen is soft.   Genitourinary:     General: Normal vulva.      Vagina: Vaginal discharge present.      Cervix: Normal. No discharge, friability or erythema.   Musculoskeletal:         General: Normal range of motion.      Right lower leg: No edema.      Left lower leg: No edema.   Lymphadenopathy:      Upper Body:      Right upper body: No supraclavicular or axillary adenopathy.      Left upper body: No supraclavicular or axillary adenopathy.   Skin:     General: Skin is warm.   Neurological:      General: No focal deficit present.      Mental Status: She is alert and oriented to person, place, and time.      Cranial Nerves: No cranial nerve deficit.   Psychiatric:         Mood and Affect: Mood normal.         Behavior: Behavior normal.                  [1]   Patient Active Problem List  Diagnosis    Axillary abscess    Hidradenitis    Psoriasis    Verruca    Eczema    Hidradenitis suppurativa    Abnormal x-ray of pelvis    Benign joint hypermobility    Multiple joint pain    Sacroiliac joint pain  Iron  deficiency anemia    Intractable vomiting with nausea

## 2024-02-17 NOTE — Unmapped (Signed)
 Zio Patch monitor explained; instructions given, including to call [employer] for any difficulties; questions answered; skin prepped; monitor placed; pt initiated; registered with [employer]. Pt advised to call [employer] for problems @ [phone].  Pt verbalized understanding and does not have any further questions at this time.  Leighton Dage, CMA

## 2024-02-17 NOTE — Unmapped (Signed)
 Please go to the emergency room if you have another episode of passing out

## 2024-02-22 ENCOUNTER — Ambulatory Visit (INDEPENDENT_AMBULATORY_CARE_PROVIDER_SITE_OTHER): Admitting: Clinical

## 2024-02-22 DIAGNOSIS — F3181 Bipolar II disorder: Secondary | ICD-10-CM | POA: Diagnosis not present

## 2024-02-22 DIAGNOSIS — F4310 Post-traumatic stress disorder, unspecified: Secondary | ICD-10-CM | POA: Diagnosis not present
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
## 2024-02-22 NOTE — Progress Notes (Unsigned)
   Darice Seats, LCSW

## 2024-02-23 DIAGNOSIS — R7303 Prediabetes: Principal | ICD-10-CM

## 2024-02-23 DIAGNOSIS — E66812 Obesity, Class II, BMI 35-39.9: Principal | ICD-10-CM

## 2024-02-23 MED ORDER — METFORMIN ER 500 MG TABLET,EXTENDED RELEASE 24 HR
ORAL_TABLET | Freq: Two times a day (BID) | ORAL | 90.00000 days
Start: 2024-02-23 — End: ?

## 2024-02-23 NOTE — Unmapped (Signed)
 Patient is requesting the following refill  Requested Prescriptions     Pending Prescriptions Disp Refills    metFORMIN  (GLUCOPHAGE -XR) 500 MG 24 hr tablet [Pharmacy Med Name: METFORMIN  ER 500MG  24HR TABS] 180 tablet      Sig: TAKE 1 TABLET(500 MG) BY MOUTH TWICE DAILY       Recent Visits  Date Type Provider Dept   02/17/24 Office Visit Mangel, Benison Pap, DO [facility] [address] [facility]   12/17/23 Office Visit Mangel, Benison Pap, DO [place] [facility] [address] [facility]   Showing recent visits within past 365 days and meeting all other requirements  Future Appointments  Date Type Provider Dept   03/30/24 Appointment Mangel, Benison Pap, DO [place] [facility] [address] [facility]   Showing future appointments within next 365 days and meeting all other requirements       Labs: A1c:   Hemoglobin A1C (%)   Date Value   12/17/2023 5.9 (H)

## 2024-03-06 ENCOUNTER — Inpatient Hospital Stay: Admit: 2024-03-06 | Discharge: 2024-03-07 | Payer: Medicaid (Managed Care)

## 2024-03-06 DIAGNOSIS — R55 Syncope and collapse: Secondary | ICD-10-CM | POA: Diagnosis not present

## 2024-03-08 ENCOUNTER — Other Ambulatory Visit: Payer: Self-pay
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
## 2024-03-09 ENCOUNTER — Ambulatory Visit (INDEPENDENT_AMBULATORY_CARE_PROVIDER_SITE_OTHER): Admitting: Clinical

## 2024-03-09 DIAGNOSIS — F4310 Post-traumatic stress disorder, unspecified: Secondary | ICD-10-CM | POA: Diagnosis not present

## 2024-03-09 DIAGNOSIS — F319 Bipolar disorder, unspecified: Secondary | ICD-10-CM | POA: Diagnosis not present

## 2024-03-09 DIAGNOSIS — F411 Generalized anxiety disorder: Secondary | ICD-10-CM

## 2024-03-09 NOTE — Progress Notes (Signed)
   Darice Seats, LCSW

## 2024-03-09 NOTE — Progress Notes (Signed)
 [facility] Counselor/Therapist Progress Note  Patient ID: Christine Norris, MRN: [id number],    Date: 03/09/2024  Time Spent: 10:34am - 11:20am : 46 minutes   Treatment Type: Individual Therapy  Reported Symptoms: anxiety, racing thoughts, irritability, restlessness, feeling on edge, decreased concentration when feeling anxious, decreased sleep when feeling anxious, worry  Mental Status Exam: Appearance:  Neat and Well Groomed     Behavior: Appropriate  Motor: Normal  Speech/Language:  Clear and Coherent and Normal Rate  Affect: Appropriate  Mood: anxious  Thought process: normal  Thought content:   WNL  Sensory/Perceptual disturbances:   WNL  Orientation: oriented to person, place, time/date, and situation  Attention: Good  Concentration: Good  Memory: WNL  Fund of knowledge:  Good  Insight:   Good  Judgment:  Good  Impulse Control: Good   Risk Assessment: Danger to Self:  No Patient denied current suicidal ideation  Self-injurious Behavior: No Danger to Others: No Patient denied current homicidal ideation Duty to Warn:no Physical Aggression / Violence:No  Access to Firearms a concern: No  Gang Involvement:No   Subjective: Patient stated, I would say it's been kind of the same in response to events since last session. Patient stated, its been back and forth in response to mood since last session.  Patient stated, more on the anxious side in response to current mood. Patient stated, I'm very anxious all the time I guess. Patient stated, as of now it doesn't affect me as much, when I was younger around my teenage years I was almost hospitalized in reference to history of depressive episodes. Patient reported experiencing racing thoughts. Patient reported manic symptoms were  a lot more severe in reference to patient's history of symptoms. Patient reported I over think a lot, my mind is always racing, overly anxious, I always have a general  sense of anxiety all the time, anxiety increases when at a restaurant, does not like being alone, irritability, restlessness, feeling on edge, decreased concentration when feeling anxious, decreased sleep when feeling anxious, worry. Patient stated, it sounds about right in response to diagnoses. Patient stated, it validates my thoughts and feelings, it explains my emotions better in response to diagnoses. Patient stated, I think that's one of my bigger issues is ignoring that type of treatment, I typically ignore it in reference to treatment of trauma and stated, I think that would be a good idea in reference to referral to clinician with expertise in treatment of trauma.   Interventions: Motivational Interviewing. Clinician conducted session via caregility video from clinician's home office. Patient provided verbal consent to proceed with telehealth session and is aware of limitations of telephone or video visits. Patient participated in session from patient's vehicle. Reviewed events since last session and assessed for changes. Clinician reviewed diagnoses and treatment recommendations. Clarified symptoms related to bipolar disorder. Provided psycho education related to diagnoses and treatment. Discussed clinician's scope of practice and the importance of connecting patient with a provider with expertise in treatment of trauma. Clinician will initiate a referral to a provider with expertise in treatment of trauma.  Collaboration of Care: Referral or follow-up with counselor/therapist AEB Discussed consent required for referral to clinician with expertise in treatment of trauma  Patient/Guardian was advised Release of Information must be obtained prior to any record release in order to collaborate their care with an outside provider. Patient/Guardian was advised if they have not already done so to contact [facility] to sign all necessary forms in order for us   to release information  regarding their care.   Consent: Patient/Guardian gives verbal consent for treatment and assignment of benefits for services provided during this visit. Patient/Guardian expressed understanding and agreed to proceed.   Diagnosis:PTSD (post-traumatic stress disorder)  Bipolar I disorder (HCC)  Generalized anxiety disorder  Plan: Clinician will initiate a referral to a provider with expertise in treatment of trauma. Goals/target dates will not be developed for this patient due to patient being referred to a clinician with expertise in treatment of trauma.   Darice Seats, LCSW

## 2024-03-14 ENCOUNTER — Encounter: Payer: Self-pay | Admitting: Psychiatry

## 2024-03-14 ENCOUNTER — Other Ambulatory Visit: Payer: Self-pay

## 2024-03-14 ENCOUNTER — Ambulatory Visit (INDEPENDENT_AMBULATORY_CARE_PROVIDER_SITE_OTHER): Admitting: Psychiatry

## 2024-03-14 VITALS — BP 112/72 | HR 69 | Temp 98.2°F | Ht 67.0 in | Wt 240.0 lb

## 2024-03-14 DIAGNOSIS — F4010 Social phobia, unspecified: Secondary | ICD-10-CM

## 2024-03-14 DIAGNOSIS — F0630 Mood disorder due to known physiological condition, unspecified: Secondary | ICD-10-CM | POA: Diagnosis not present

## 2024-03-14 DIAGNOSIS — F4310 Post-traumatic stress disorder, unspecified: Secondary | ICD-10-CM | POA: Diagnosis not present

## 2024-03-14 MED ORDER — LAMOTRIGINE 100 MG PO TABS
100.0000 mg | ORAL_TABLET | Freq: Every day | ORAL | 1 refills | Status: DC
  Filled 2024-03-14 – 2024-04-19 (×2): qty 30, 30d supply, fill #0

## 2024-03-14 NOTE — Patient Instructions (Signed)
 Increase lamotrigine  100 mg daily  Next appointment- 12/4 at 1 pm

## 2024-03-24 ENCOUNTER — Other Ambulatory Visit: Payer: Self-pay

## 2024-03-30 NOTE — Telephone Encounter (Signed)
 Copied from CRM #[id number]. Topic: Scheduling - Appointment Arrival  >> Mar 30, 2024  9:26 AM Ileana NOVAK wrote:  Patient notifying clinic they are running late. Pt is running 5-10 min late.Notified pt if they are more than 15 mins late, it is up to provider's discretion whether they will be seen.

## 2024-04-19 ENCOUNTER — Other Ambulatory Visit: Payer: Self-pay
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
## 2024-05-04 ENCOUNTER — Other Ambulatory Visit: Payer: Self-pay

## 2024-05-04 ENCOUNTER — Encounter: Payer: Self-pay | Admitting: Psychiatry

## 2024-05-04 ENCOUNTER — Ambulatory Visit: Admitting: Psychiatry

## 2024-05-04 VITALS — BP 118/78 | HR 92 | Temp 97.8°F | Ht 67.0 in | Wt 233.4 lb

## 2024-05-04 DIAGNOSIS — F0630 Mood disorder due to known physiological condition, unspecified: Secondary | ICD-10-CM | POA: Diagnosis not present

## 2024-05-04 DIAGNOSIS — F4010 Social phobia, unspecified: Secondary | ICD-10-CM | POA: Diagnosis not present

## 2024-05-04 DIAGNOSIS — F4310 Post-traumatic stress disorder, unspecified: Secondary | ICD-10-CM

## 2024-05-04 MED ORDER — LAMOTRIGINE 150 MG PO TABS
150.0000 mg | ORAL_TABLET | Freq: Every day | ORAL | 1 refills | Status: DC
  Filled 2024-05-04 – 2024-05-23 (×2): qty 30, 30d supply, fill #0
  Filled 2024-06-23 (×2): qty 30, 30d supply, fill #1

## 2024-05-04 NOTE — Patient Instructions (Addendum)
 Increase lamotrigine  150 mg daily  Next appointment-  1/29 at 8:30

## 2024-05-14 ENCOUNTER — Other Ambulatory Visit: Payer: Self-pay

## 2024-05-23 ENCOUNTER — Other Ambulatory Visit: Payer: Self-pay

## 2024-06-05 NOTE — Telephone Encounter (Signed)
 Copied from CRM #[id number]. Topic: Scheduling - New Appointment  >> Jun 05, 2024  2:51 PM Christy Mercado wrote:  error       Additional Requests/Needs: No

## 2024-06-23 ENCOUNTER — Other Ambulatory Visit: Payer: Self-pay
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
## 2024-06-27 NOTE — Telephone Encounter (Signed)
 Copied from CRM [id number]. Topic: Access To Clinicians - Req Clinic Call Back  >> Jun 27, 2024 12:47 PM Comer Halo wrote:  Reason of the Call: Patient is calling requesting Flu shot and TB testing through blood work, requesting call back.    Requesting: The patient is requesting Flu shot and TB testing through blood work before Thursday 1/29.    Supporting details:The patient was offered nurse visit for FLU shot but pt declined. She stated that she would like a call back due to wanting all of testing done before Thursday 1/29.    Appointment: They declined to schedule an appointment.      Does the caller want to be contacted regarding this request? Yes. Please contact The patient by Cell Phone Telephone Information:  Mobile          [phone]      Urgent callback turnaround time: within 24 business hours. Programmer, Systems Notified)

## 2024-06-27 NOTE — Telephone Encounter (Signed)
 Left voicemail and MyChart message asking patient if she will be following Dr. Keven to [facility] or staying at [facility]. Expressed to patient that we need to know her preference so that we are able to forward her message to the appropriate clinical team or provider. Awaiting response from patient.

## 2024-06-29 ENCOUNTER — Ambulatory Visit (INDEPENDENT_AMBULATORY_CARE_PROVIDER_SITE_OTHER): Admitting: Psychiatry

## 2024-06-29 ENCOUNTER — Other Ambulatory Visit: Payer: Self-pay

## 2024-06-29 ENCOUNTER — Encounter: Payer: Self-pay | Admitting: Psychiatry

## 2024-06-29 VITALS — BP 112/79 | HR 77 | Temp 96.5°F | Ht 67.0 in | Wt 228.2 lb

## 2024-06-29 DIAGNOSIS — F0630 Mood disorder due to known physiological condition, unspecified: Secondary | ICD-10-CM

## 2024-06-29 DIAGNOSIS — F4310 Post-traumatic stress disorder, unspecified: Secondary | ICD-10-CM | POA: Diagnosis not present

## 2024-06-29 DIAGNOSIS — F4010 Social phobia, unspecified: Secondary | ICD-10-CM

## 2024-06-29 MED ORDER — VENLAFAXINE HCL ER 37.5 MG PO CP24
37.5000 mg | ORAL_CAPSULE | Freq: Every day | ORAL | 0 refills | Status: AC
  Filled 2024-06-29: qty 7, 7d supply, fill #0

## 2024-06-29 MED ORDER — VENLAFAXINE HCL ER 75 MG PO CP24
75.0000 mg | ORAL_CAPSULE | Freq: Every day | ORAL | 1 refills | Status: AC
  Filled 2024-06-29: qty 19, 19d supply, fill #0
  Filled 2024-06-29: qty 11, 11d supply, fill #0

## 2024-06-29 MED ORDER — LAMOTRIGINE 100 MG PO TABS
100.0000 mg | ORAL_TABLET | Freq: Every day | ORAL | 1 refills | Status: AC
  Filled 2024-06-29: qty 30, 30d supply, fill #0

## 2024-06-29 NOTE — Telephone Encounter (Signed)
 Good afternoon, could you please contact this patient to schedule LAB VISIT for Quantiferon gold testing (order is placed) as well as NURSE visit for the flu shot? Thank you!

## 2024-06-29 NOTE — Patient Instructions (Signed)
 Decrease lamotrigine  100 mg daily  Start venlafaxine  37.5 mg daily for one week, then increase to 75 mg daily  Next appointment-  3/19 10:30

## 2024-06-29 NOTE — Addendum Note (Signed)
 Addended by: KEVEN CRUMBLY PAP on: 06/29/2024 01:05 PM     Modules accepted: Orders

## 2024-07-05 ENCOUNTER — Emergency Department
Admission: EM | Admit: 2024-07-05 | Discharge: 2024-07-05 | Disposition: A | Discharge status: Home / Self Care | Attending: Emergency Medicine | Admitting: Emergency Medicine

## 2024-07-05 ENCOUNTER — Emergency Department

## 2024-07-05 ENCOUNTER — Other Ambulatory Visit: Payer: Self-pay

## 2024-07-05 DIAGNOSIS — R55 Syncope and collapse: Secondary | ICD-10-CM | POA: Insufficient documentation

## 2024-07-05 LAB — CBC
HCT: 39.5 % (ref 36.0–46.0)
Hemoglobin: 12.3 g/dL (ref 12.0–15.0)
MCH: 24.5 pg — ABNORMAL LOW (ref 26.0–34.0)
MCHC: 31.1 g/dL (ref 30.0–36.0)
MCV: 78.5 fL — ABNORMAL LOW (ref 80.0–100.0)
Platelets: 416 10*3/uL — ABNORMAL HIGH (ref 150–400)
RBC: 5.03 MIL/uL (ref 3.87–5.11)
RDW: 13.5 % (ref 11.5–15.5)
WBC: 6.6 10*3/uL (ref 4.0–10.5)
nRBC: 0 % (ref 0.0–0.2)

## 2024-07-05 LAB — COMPREHENSIVE METABOLIC PANEL WITH GFR
ALT: 7 U/L (ref 0–44)
AST: 20 U/L (ref 15–41)
Albumin: 4.2 g/dL (ref 3.5–5.0)
Alkaline Phosphatase: 63 U/L (ref 38–126)
Anion gap: 11 (ref 5–15)
BUN: 11 mg/dL (ref 6–20)
CO2: 25 mmol/L (ref 22–32)
Calcium: 9 mg/dL (ref 8.9–10.3)
Chloride: 105 mmol/L (ref 98–111)
Creatinine, Ser: 0.7 mg/dL (ref 0.44–1.00)
GFR, Estimated: >60 mL/min
Glucose, Bld: 79 mg/dL (ref 70–99)
Potassium: 4.1 mmol/L (ref 3.5–5.1)
Sodium: 141 mmol/L (ref 135–145)
Total Bilirubin: 0.3 mg/dL (ref 0.0–1.2)
Total Protein: 7.1 g/dL (ref 6.5–8.1)

## 2024-07-05 LAB — URINALYSIS, ROUTINE W REFLEX MICROSCOPIC
Bilirubin Urine: NEGATIVE
Glucose, UA: NEGATIVE mg/dL
Ketones, ur: 20 mg/dL — AB
Leukocytes,Ua: NEGATIVE
Nitrite: NEGATIVE
Protein, ur: NEGATIVE mg/dL
Specific Gravity, Urine: 1.024 (ref 1.005–1.030)
pH: 7 (ref 5.0–8.0)

## 2024-07-05 LAB — TROPONIN T, HIGH SENSITIVITY
Troponin T High Sensitivity: <6 ng/L (ref 0–19)
Troponin T High Sensitivity: <6 ng/L (ref 0–19)

## 2024-07-05 LAB — POC URINE PREG, ED: Preg Test, Ur: NEGATIVE

## 2024-07-05 MED ORDER — ACETAMINOPHEN 325 MG PO TABS
650.0000 mg | ORAL_TABLET | Freq: Once | ORAL | Status: AC
  Administered 2024-07-05: 650 mg via ORAL
  Filled 2024-07-05: qty 2

## 2024-07-05 MED ORDER — ONDANSETRON HCL 4 MG/2ML IJ SOLN
4.0000 mg | Freq: Once | INTRAMUSCULAR | Status: AC
  Administered 2024-07-05: 4 mg via INTRAVENOUS
  Filled 2024-07-05: qty 2

## 2024-07-05 MED ORDER — SODIUM CHLORIDE 0.9 % IV BOLUS
500.0000 mL | Freq: Once | INTRAVENOUS | Status: AC
  Administered 2024-07-05: 500 mL via INTRAVENOUS

## 2024-07-05 NOTE — Discharge Instructions (Signed)
 Please be sure to take your medications as prescribed, please make sure to keep yourself hydrated.  Please follow-up with your primary care doctor this week or next week to get reassessed.

## 2024-07-05 NOTE — ED Triage Notes (Signed)
 See first nurse note: pt reports feeling dizzy. +h/a.

## 2024-07-05 NOTE — ED Provider Notes (Signed)
 "  [facility] Provider Note    Event Date/Time   First MD Initiated Contact with Patient 07/05/24 1342     (approximate)   History   Loss of Consciousness   HPI  Christine Norris is a 24 y.o. female with a history of PTSD, bipolar disorder, and psoriasis who presents with an apparent syncopal episode.  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  Subsequently when other staff arrived at 1030 they found the patient on the floor passed out.  It appeared that she had hit her head.  The patient states that she woke up this morning and felt fine.  She did not eat breakfast although this is not unusual for her.  She has a faint memory of arriving at [employer], but does not remember anything that happened afterwards.  Currently she reports a headache and nausea.  She feels weak and somewhat dizzy.  She had some pain on the left side of her body from where she fell, but this is improved.  She denies any prior history of similar syncopal episodes.  She does not have any history of seizures.  She is on Lamictal  for mental health indications, and was recently started on venlafaxine .  I reviewed the past medical records.  The patient's most recent outpatient encounter was on 1/29 with psychiatry for medication adjustment.  The venlafaxine  was started after that visit.   Physical Exam   Triage Vital Signs: ED Triage Vitals  Encounter Vitals Group     BP 07/05/24 1137 (!) 144/89     Girls Systolic BP Percentile --      Girls Diastolic BP Percentile --      Boys Systolic BP Percentile --      Boys Diastolic BP Percentile --      Pulse Rate 07/05/24 1137 83     Resp 07/05/24 1137 20     Temp 07/05/24 1138 98.4 F (36.9 C)     Temp src --      SpO2 07/05/24 1137 100 %     Weight --      Height --      Head Circumference --      Peak Flow --      Pain Score 07/05/24 1138 3     Pain Loc --      Pain  Education --      Exclude from Growth Chart --     Most recent vital signs: Vitals:   07/05/24 1137 07/05/24 1138  BP: (!) 144/89   Pulse: 83   Resp: 20   Temp:  98.4 F (36.9 C)  SpO2: 100%      General: Sleepy appearing but fully arousable, oriented x 4, no distress.  CV:  Good peripheral perfusion.  Resp:  Normal effort.  Abd:  No distention.  Other:  EOMI.  PERRLA.  Mild photophobia.  No facial droop.  Normal speech.  Motor intact in all extremities.  No ataxia on finger-to-nose.  No midline cervical spinal tenderness.  Mild right midline paraspinal tenderness.   ED Results / Procedures / Treatments   Labs (all labs ordered are listed, but only abnormal results are displayed) Labs Reviewed  CBC - Abnormal; Notable for the following components:      Result Value   MCV 78.5 (*)    MCH 24.5 (*)    Platelets 416 (*)    All other  components within normal limits  COMPREHENSIVE METABOLIC PANEL WITH GFR  URINALYSIS, ROUTINE W REFLEX MICROSCOPIC  POC URINE PREG, ED  TROPONIN T, HIGH SENSITIVITY     EKG  ED ECG REPORT I, Waylon Cassis, the attending physician, personally viewed and interpreted this ECG.  Date: 07/05/2024 EKG Time: 1143 Rate: 84 Rhythm: normal sinus rhythm QRS Axis: normal Intervals: normal ST/T Wave abnormalities: normal Narrative Interpretation: no evidence of acute ischemia    RADIOLOGY  CT head: Pending  PROCEDURES:  Critical Care performed: No  Procedures   MEDICATIONS ORDERED IN ED: Medications  ondansetron  (ZOFRAN ) injection 4 mg (has no administration in time range)  sodium chloride  0.9 % bolus 500 mL (has no administration in time range)  acetaminophen  (TYLENOL ) tablet 650 mg (has no administration in time range)     IMPRESSION / MDM / ASSESSMENT AND PLAN / ED COURSE  I reviewed the triage vital signs and the nursing notes.  24 year old female with PMH as noted above presents after an episode of loss of  consciousness.  She arrived at a worksite approximately 30 minutes prior to other people, and was found down on the ground having apparently passed out.  The patient does not really remember what happened.  On exam, her vital signs are normal.  She is sleepy appearing but fully arousable and oriented.  Thorough neurologic exam is nonfocal.  There is no significant trauma on exam.  Differential diagnosis includes, but is not limited to, syncope, seizure, concussion, less likely TBI.  We will obtain basic labs, cardiac enzymes.  Given the head trauma as well as the persistent sleepiness and nausea, we will obtain a CT head.  I have ordered a fluid bolus and Zofran  as well.  Patient's presentation is most consistent with acute complicated illness / injury requiring diagnostic workup.  ----------------------------------------- 3:24 PM on 07/05/2024 -----------------------------------------  CMP and CBC showed no acute findings.  Troponin is negative.  CT is pending.  I have signed the patient out to the oncoming ED physician Dr. Waymond.   FINAL CLINICAL IMPRESSION(S) / ED DIAGNOSES   Final diagnoses:  Syncope and collapse     Rx / DC Orders   ED Discharge Orders     None        Note:  This document was prepared using Dragon voice recognition software and may include unintentional dictation errors.    Cassis Waylon, MD 07/05/24 1524  "

## 2024-07-05 NOTE — ED Provider Notes (Signed)
.----------------------------------------- °  3:35 PM on 07/05/2024 -----------------------------------------  Blood pressure (!) 144/89, pulse 83, temperature 98.4 F (36.9 C), resp. rate 20, last menstrual period 06/29/2024, SpO2 100%.  In short, Christine Norris is a 24 y.o. female with a chief complaint of LOC.  Refer to the original H&P for additional details.  The current plan of care is to follow-up UA, CT head, if negative and back to baseline, likely discharge.  CT head and labs are reassuring.  On reassessment patient is feeling a lot better, did discuss with her about imaging and lab results.  Encouraged hydration, lower to avoid any exertional activity until she is feeling a lot better.  Instructed her to follow-up primary care this week for next week to get reassessed.  Otherwise considered no indication for inpatient admission at this time, she is safe for outpatient management.  Will discharge with strict return precautions.  Shared decision making done with patient and she is agreeable with this plan.   Clinical Course as of 07/05/24 1623  Wed Jul 05, 2024  1543 CT Head Wo Contrast No acute intracranial pathology.  [TT]  1543 Independent review of labs, electrolytes severely deranged, LFTs are normal, pregnancy test is negative, no leukocytosis, UA is not consistent with UTI, initial troponin is negative. [TT]  1618 Troponin T, High Sensitivity  x 2 is not elevated. [TT]    Clinical Course User Index [TT] Waymond Lorelle Cummins, MD     Medications  ondansetron  (ZOFRAN ) injection 4 mg (4 mg Intravenous Given 07/05/24 1539)  sodium chloride  0.9 % bolus 500 mL (500 mLs Intravenous New Bag/Given 07/05/24 1540)  acetaminophen  (TYLENOL ) tablet 650 mg (650 mg Oral Given 07/05/24 1535)     ED Discharge Orders     None      Final diagnoses:  Syncope and collapse      Waymond Lorelle Cummins, MD 07/05/24 1623

## 2024-07-05 NOTE — ED Triage Notes (Signed)
 FIRST RN: pt to ED via ems from work- found by worker passed out in back room. Unsure how long pt was down. Pt A/O x 4 currently c/o dizziness. Dizziness since starting on lamotrigine  on 1/29.   HR: 93-110 160/110 CBG: 87

## 2024-08-17 ENCOUNTER — Ambulatory Visit: Admitting: Psychiatry
